# Patient Record
Sex: Male | Born: 1970 | Smoking: Never smoker
Health system: Southern US, Community
[De-identification: ages and names within clinical notes are randomized; demographics above are authoritative.]

## PROBLEM LIST (undated history)

## (undated) HISTORY — PX: APPENDECTOMY: SHX54

## (undated) HISTORY — PX: ANTERIOR CRUCIATE LIGAMENT REPAIR: SHX115

---

## 2021-04-18 ENCOUNTER — Other Ambulatory Visit: Payer: Self-pay

## 2021-04-18 ENCOUNTER — Ambulatory Visit (INDEPENDENT_AMBULATORY_CARE_PROVIDER_SITE_OTHER): Payer: BC Managed Care – PPO | Admitting: Family Medicine

## 2021-04-18 ENCOUNTER — Encounter: Payer: Self-pay | Admitting: Family Medicine

## 2021-04-18 VITALS — BP 122/68 | HR 62 | Temp 98.1°F | Resp 16 | Wt 199.2 lb

## 2021-04-18 DIAGNOSIS — L719 Rosacea, unspecified: Secondary | ICD-10-CM

## 2021-04-18 DIAGNOSIS — R1011 Right upper quadrant pain: Secondary | ICD-10-CM | POA: Diagnosis not present

## 2021-04-18 DIAGNOSIS — R21 Rash and other nonspecific skin eruption: Secondary | ICD-10-CM

## 2021-04-18 DIAGNOSIS — R12 Heartburn: Secondary | ICD-10-CM | POA: Diagnosis not present

## 2021-04-18 DIAGNOSIS — Z13220 Encounter for screening for lipoid disorders: Secondary | ICD-10-CM | POA: Diagnosis not present

## 2021-04-18 DIAGNOSIS — R140 Abdominal distension (gaseous): Secondary | ICD-10-CM

## 2021-04-18 DIAGNOSIS — Z1211 Encounter for screening for malignant neoplasm of colon: Secondary | ICD-10-CM

## 2021-04-18 LAB — LDL CHOLESTEROL, DIRECT: Direct LDL: 139 mg/dL

## 2021-04-18 LAB — COMPREHENSIVE METABOLIC PANEL
ALT: 31 U/L (ref 0–53)
AST: 23 U/L (ref 0–37)
Albumin: 4.3 g/dL (ref 3.5–5.2)
Alkaline Phosphatase: 57 U/L (ref 39–117)
BUN: 19 mg/dL (ref 6–23)
CO2: 30 mEq/L (ref 19–32)
Calcium: 9.6 mg/dL (ref 8.4–10.5)
Chloride: 102 mEq/L (ref 96–112)
Creatinine, Ser: 1.01 mg/dL (ref 0.40–1.50)
GFR: 85.59 mL/min (ref >60.00)
Glucose, Bld: 90 mg/dL (ref 70–99)
Potassium: 4.4 mEq/L (ref 3.5–5.1)
Sodium: 137 mEq/L (ref 135–145)
Total Bilirubin: 0.4 mg/dL (ref 0.2–1.2)
Total Protein: 7.2 g/dL (ref 6.0–8.3)

## 2021-04-18 LAB — LIPID PANEL
Cholesterol: 241 mg/dL — ABNORMAL HIGH (ref 0–200)
HDL: 50.9 mg/dL (ref >39.00)
NonHDL: 189.71
Total CHOL/HDL Ratio: 5
Triglycerides: 366 mg/dL — ABNORMAL HIGH (ref 0.0–149.0)
VLDL: 73.2 mg/dL — ABNORMAL HIGH (ref 0.0–40.0)

## 2021-04-18 MED ORDER — METRONIDAZOLE 1 % EX GEL: Freq: Every day | CUTANEOUS | 0 refills | Status: DC

## 2021-04-18 MED ORDER — OMEPRAZOLE 20 MG PO CPDR: 20.0000 mg | DELAYED_RELEASE_CAPSULE | Freq: Every day | ORAL | 1 refills | Status: DC

## 2021-04-18 NOTE — Patient Instructions (Signed)
Facial rash could be a combination of irritation from mask, also suspect a component of rosacea.  See information below.  Avoidance of spicy foods, sunlight sometimes can be helpful but can try metronidazole topical once per day for now as well.  Recheck next few weeks.  See information below on heartburn and foods that may contribute.  Can start omeprazole once per day for now, recheck next few weeks.  If bloating/heartburn is not improving can discuss other possible causes and treatments.  Follow-up sooner if worse.  Can discuss lab work at next visit.  Thank you for coming in today.  Heartburn Heartburn is a type of pain or discomfort that can happen in the throat or chest. It is often described as a burning pain. It may also cause a bad, acid-like taste in the mouth. Heartburn may feel worse when you lie down or bend over, and it is often worse at night. Heartburn may be caused by stomach contents that move back up into the esophagus (reflux). Follow these instructions at home: Eating and drinking  Avoid certain foods and drinks as told by your health care provider. This may include: Coffee and tea, with or without caffeine. Drinks that contain alcohol. Energy drinks and sports drinks. Carbonated drinks or sodas. Chocolate and cocoa. Peppermint and mint flavorings. Garlic and onions. Horseradish. Spicy and acidic foods, including peppers, chili powder, curry powder, vinegar, hot sauces, and barbecue sauce. Citrus fruit juices and citrus fruits, such as oranges, lemons, and limes. Tomato-based foods, such as red sauce, chili, salsa, and pizza with red sauce. Fried and fatty foods, such as donuts, french fries, potato chips, and high-fat dressings. High-fat meats, such as hot dogs and fatty cuts of red and white meats, such as rib eye steak, sausage, ham, and bacon. High-fat dairy items, such as whole milk, butter, and cream cheese. Eat small, frequent meals instead of large meals. Avoid  drinking large amounts of liquid with your meals. Avoid eating meals during the 2-3 hours before bedtime. Avoid lying down right after you eat. Do not exercise right after you eat. Lifestyle   If you are overweight, reduce your weight to an amount that is healthy for you. Ask your health care provider for guidance about a safe weight loss goal. Do not use any products that contain nicotine or tobacco. These products include cigarettes, chewing tobacco, and vaping devices, such as e-cigarettes. These can make symptoms worse. If you need help quitting, ask your health care provider. Wear loose-fitting clothing. Do not wear anything tight around your waist that causes pressure on your abdomen. Raise (elevate) the head of your bed about 6 inches (15 cm) when you sleep. You can use a wedge to do this. Try to reduce your stress, such as with yoga or meditation. If you need help reducing stress, ask your health care provider. Medicines Take over-the-counter and prescription medicines only as told by your health care provider. Do not take aspirin or NSAIDs, such as ibuprofen, unless your health care provider told you to do so. Stop medicines only as told by your health care provider. If you stop taking some medicines too quickly, your symptoms may get worse. General instructions Pay attention to any changes in your symptoms. Keep all follow-up visits. This is important. Contact a health care provider if: You have new symptoms. You have unexplained weight loss. You have difficulty swallowing, or it hurts to swallow. You have wheezing or a persistent cough. Your symptoms do not improve with treatment.  You have frequent heartburn for more than 2 weeks. Get help right away if: You suddenly have pain in your arms, neck, jaw, teeth, or back. You suddenly feel sweaty, dizzy, or light-headed. You have chest pain or shortness of breath. You vomit and your vomit looks like blood or coffee grounds. Your  stool is bloody or black. These symptoms may represent a serious problem that is an emergency. Do not wait to see if the symptoms will go away. Get medical help right away. Call your local emergency services (911 in the [place] Do not drive yourself to the hospital. Summary Heartburn is a type of pain or discomfort that can happen in the throat or chest. It is often described as a burning pain. It may also cause a bad, acid-like taste in the mouth. Avoid certain foods and drinks as told by your health care provider. Take over-the-counter and prescription medicines only as told by your health care provider. Do not take aspirin or NSAIDs, such as ibuprofen, unless your health care provider told you to do so. Contact a health care provider if your symptoms do not improve or they get worse. This information is not intended to replace advice given to you by your health care provider. Make sure you discuss any questions you have with your health care provider. Document Revised: 01/03/2020 Document Reviewed: 01/03/2020 Else[employer] Patient Education  2022 [employer]   Rosacea Rosacea is a long-term (chronic) condition that affects the skin of the face, including the cheeks, nose, forehead, and chin. This condition can also affect the eyes. Rosacea causes blood vessels near the surface of the skin to enlarge, which results in redness. What are the causes? The cause of this condition is not known. Certain triggers can make rosacea worse, including: Hot baths. Exercise. Sunlight. Very hot or cold temperatures. Hot or spicy foods and drinks. Drinking alcohol. Stress. Taking blood pressure medicine. Long-term use of topical steroids on the face. What increases the risk? You are more likely to develop this condition if you: Are older than 52 years of age. Are a woman. Have light-colored skin (light complexion). Have a family history of rosacea. What are the signs or symptoms? Symptoms of this  condition include: Redness of the face. Red bumps or pimples on the face. A red, enlarged nose. Blushing easily. Red lines on the skin. Irritated, burning, or itchy feeling in the eyes. Swollen eyelids. Drainage from the eyes. Feeling like there is something in your eye. How is this diagnosed? This condition is diagnosed with a medical history and physical exam. How is this treated? There is no cure for this condition, but treatment can help to control your symptoms. Your health care provider may recommend that you see a skin specialist (dermatologist). Treatment may include: Medicines that are applied to the skin or taken by mouth (orally). This can include antibiotic medicines. Laser treatment to improve the appearance of the skin. Surgery. This is rare. Your health care provider will also recommend the best way to take care of your skin. Even after your skin improves, you will likely need to continue treatment to prevent your rosacea from coming back. Follow these instructions at home: Skin care Take care of your skin as told by your health care provider. You may be told to do these things: Wash your skin gently two or more times each day. Use mild soap. Use a sunscreen or sunblock with SPF 30 or greater. Use gentle cosmetics that are meant for sensitive skin. Shave  with an electric shaver instead of a blade. Lifestyle Try to keep track of what foods trigger this condition. Avoid any triggers. These may include: Spicy foods. Seafood. Cheese. Hot liquids. Nuts. Chocolate. Iodized salt. Do not drink alcohol. Avoid extremely cold or hot temperatures. Try to reduce your stress. If you need help, talk with your health care provider. When you exercise, do these things to stay cool: Limit sun exposure to your face. Use a fan. Do shorter and more frequent intervals of exercise. General instructions Take and apply over-the-counter and prescription medicines only as told by your  health care provider. If you were prescribed an antibiotic medicine, apply it or take it as told by your health care provider. Do not stop using the antibiotic even if your condition improves. If your eyelids are affected, apply warm compresses to them. Do this as told by your health care provider. Keep all follow-up visits as told by your health care provider. This is important. Contact a health care provider if: Your symptoms get worse. Your symptoms do not improve after 2 months of treatment. You have new symptoms. You have any changes in vision or you have problems with your eyes, such as redness or itching. You feel depressed. You lose your appetite. You have trouble concentrating. Summary Rosacea is a long-term (chronic) condition that affects the skin of the face, including the cheeks, nose, forehead, and chin. Take care of your skin as told by your health care provider. Take and apply over-the-counter and prescription medicines only as told by your health care provider. Contact a health care provider if your symptoms get worse or if you have any changes in vision or other problems with your eyes, such as redness or itching. Keep all follow-up visits as told by your health care provider. This is important. This information is not intended to replace advice given to you by your health care provider. Make sure you discuss any questions you have with your health care provider. Document Revised: 12/01/2017 Document Reviewed: 12/01/2017 Elsevier Patient Education  2022 [employer].

## 2021-04-18 NOTE — Progress Notes (Signed)
Subjective:  Patient ID: Douglas Mcfarland, male    DOB: Sep 06, 1970  Age: 50 y.o. MRN: [id number]  CC:  Chief Complaint  Patient presents with   Establish Care    Pt here to establish care, notes since starting to wear masks he has some eczema under his mask he uses OTC creams for but little relief would like other recommendation, some bloating post eating but pt notes new over last week can monitor and return if recommended     HPI Douglas Mcfarland presents for   New patient establish care, recently moved in July from [place]. Had been working for company here past 2 years. His wife is also my patient, son is followed by my colleague Douglas Mcfarland. Moved for work, looking at options locally. Prior Patent examiner for [employer]   Facial rash: Notes since use of mask with pandemic? Past 59mo - year. Cheeks, face, only in area of rash. No other rash. Better with cloth mask, worse with N95, or in sun - more red.  On forehead after son. Frequent flying - double mask.  Alcohol: none.  Spicy food - does eat frequently. No change in face rash.  Tx: otc steroid 1% once every few weeks when worse - improves.  Recently using unknown serum.    Abdominal bloating: Noted after eating past week or two.  No f/n/v. Some heartburn. No treatments.  No unexplained weight loss. No melena/hematochezia.  Runner. Training for 1/2 marathon.  8 week into 14 week training. Some rib pain with running at times. Better with belching. Notices with eating at times as above.   HM: Per care everywhere, had mild hyperlipidemia with total cholesterol 204, LDL 120, triglycerides 144, HDL at 55 in April 2019. Cologuard 04/15/2020 per care everywhere -never submitted.  No FH of colon cancer Screening options with colonoscopy versus Cologuard discussed. Discussed timing of repeat testing intervals if normal, as well as potential need for diagnostic Colonoscopy if positive Cologuard. Understanding expressed, and chose  Cologuard.  No FH of prostate CA.  The natural history of prostate cancer and ongoing controversy regarding screening and potential treatment outcomes of prostate cancer has been discussed with the patient. The meaning of a false positive PSA and a false negative PSA has been discussed. He indicates understanding of the limitations of this screening test and wishes NOT to proceed with screening PSA testing.     History There are no problems to display for this patient.  History reviewed. No pertinent past medical history. Past Surgical History:  Procedure Laterality Date   ANTERIOR CRUCIATE LIGAMENT REPAIR Right    2012   APPENDECTOMY     1984   Not on File Prior to Admission medications   Not on File   Social History   Socioeconomic History   Marital status: Unknown    Spouse name: Not on file   Number of children: Not on file   Years of education: Not on file   Highest education level: Not on file  Occupational History   Not on file  Tobacco Use   Smoking status: Never   Smokeless tobacco: Never  Substance and Sexual Activity   Alcohol use: Never   Drug use: Never   Sexual activity: Yes  Other Topics Concern   Not on file  Social History Narrative   Not on file   Social Determinants of Health   Financial Resource Strain: Not on file  Food Insecurity: Not on file  Transportation Needs: Not  on file  Physical Activity: Not on file  Stress: Not on file  Social Connections: Not on file  Intimate Partner Violence: Not on file    Review of Systems Per HPI.   Objective:   Vitals:   04/18/21 1055  BP: 122/68  Pulse: 62  Resp: 16  Temp: 98.1 F (36.7 C)  TempSrc: Temporal  SpO2: 98%  Weight: 199 lb 3.2 oz (90.4 kg)     Physical Exam Vitals reviewed.  Constitutional:      Appearance: He is well-developed.  HENT:     Head: Normocephalic and atraumatic.  Neck:     Vascular: No carotid bruit or JVD.  Cardiovascular:     Rate and Rhythm: Normal  rate and regular rhythm.     Heart sounds: Normal heart sounds. No murmur heard. Pulmonary:     Effort: Pulmonary effort is normal.     Breath sounds: Normal breath sounds. No rales.  Abdominal:     General: Abdomen is flat. There is no distension.     Palpations: Abdomen is soft.     Tenderness: There is no abdominal tenderness. There is no right CVA tenderness, left CVA tenderness or guarding.     Comments: Locates previous area of discomfort at the right upper quadrant but nontender on exam, negative Murphy's.  Nondistended.  Musculoskeletal:     Right lower leg: No edema.     Left lower leg: No edema.  Skin:    General: Skin is warm and dry.       Neurological:     Mental Status: He is alert and oriented to person, place, and time.  Psychiatric:        Mood and Affect: Mood normal.    45 minutes spent during visit, including chart review, care everywhere lab review, counseling and assimilation of information, exam, discussion of plan, and chart completion.    Assessment & Plan:  Douglas Mcfarland is a 50 y.o. male . Facial rash - Plan: metroNIDAZOLE (METROGEL) 1 % gel Rosacea - Plan: metroNIDAZOLE (METROGEL) 1 % gel  -Acne versus rosacea versus combination of the 2.  Given symptom flare with sun exposure and exam findings, leaning more towards rosacea.  Metronidazole gel 1% topical, handout given on rosacea and triggers, recheck next few weeks  Bloating - Plan: Comprehensive metabolic panel Heartburn - Plan: omeprazole (PRILOSEC) 20 MG capsule RUQ discomfort - Plan: Comprehensive metabolic panel  -Handout on trigger avoidance for reflux/GERD, likely cause of right upper quadrant discomfort but will check CMP.  Reassuring exam.  Start omeprazole once per day, recheck next few weeks.  Screen for colon cancer - Plan: Cologuard  Screening for hyperlipidemia - Plan: Lipid panel  -Prior borderline elevation, repeat labs as fasting today.  Meds ordered this encounter  Medications    metroNIDAZOLE (METROGEL) 1 % gel    Sig: Apply topically daily.    Dispense:  45 g    Refill:  0   omeprazole (PRILOSEC) 20 MG capsule    Sig: Take 1 capsule (20 mg total) by mouth daily.    Dispense:  30 capsule    Refill:  1   Patient Instructions  Facial rash could be a combination of irritation from mask, also suspect a component of rosacea.  See information below.  Avoidance of spicy foods, sunlight sometimes can be helpful but can try metronidazole topical once per day for now as well.  Recheck next few weeks.  See information below on heartburn and foods that  may contribute.  Can start omeprazole once per day for now, recheck next few weeks.  If bloating/heartburn is not improving can discuss other possible causes and treatments.  Follow-up sooner if worse.  Can discuss lab work at next visit.  Thank you for coming in today.  Heartburn Heartburn is a type of pain or discomfort that can happen in the throat or chest. It is often described as a burning pain. It may also cause a bad, acid-like taste in the mouth. Heartburn may feel worse when you lie down or bend over, and it is often worse at night. Heartburn may be caused by stomach contents that move back up into the esophagus (reflux). Follow these instructions at home: Eating and drinking  Avoid certain foods and drinks as told by your health care provider. This may include: Coffee and tea, with or without caffeine. Drinks that contain alcohol. Energy drinks and sports drinks. Carbonated drinks or sodas. Chocolate and cocoa. Peppermint and mint flavorings. Garlic and onions. Horseradish. Spicy and acidic foods, including peppers, chili powder, curry powder, vinegar, hot sauces, and barbecue sauce. Citrus fruit juices and citrus fruits, such as oranges, lemons, and limes. Tomato-based foods, such as red sauce, chili, salsa, and pizza with red sauce. Fried and fatty foods, such as donuts, french fries, potato chips, and high-fat  dressings. High-fat meats, such as hot dogs and fatty cuts of red and white meats, such as rib eye steak, sausage, ham, and bacon. High-fat dairy items, such as whole milk, butter, and cream cheese. Eat small, frequent meals instead of large meals. Avoid drinking large amounts of liquid with your meals. Avoid eating meals during the 2-3 hours before bedtime. Avoid lying down right after you eat. Do not exercise right after you eat. Lifestyle   If you are overweight, reduce your weight to an amount that is healthy for you. Ask your health care provider for guidance about a safe weight loss goal. Do not use any products that contain nicotine or tobacco. These products include cigarettes, chewing tobacco, and vaping devices, such as e-cigarettes. These can make symptoms worse. If you need help quitting, ask your health care provider. Wear loose-fitting clothing. Do not wear anything tight around your waist that causes pressure on your abdomen. Raise (elevate) the head of your bed about 6 inches (15 cm) when you sleep. You can use a wedge to do this. Try to reduce your stress, such as with yoga or meditation. If you need help reducing stress, ask your health care provider. Medicines Take over-the-counter and prescription medicines only as told by your health care provider. Do not take aspirin or NSAIDs, such as ibuprofen, unless your health care provider told you to do so. Stop medicines only as told by your health care provider. If you stop taking some medicines too quickly, your symptoms may get worse. General instructions Pay attention to any changes in your symptoms. Keep all follow-up visits. This is important. Contact a health care provider if: You have new symptoms. You have unexplained weight loss. You have difficulty swallowing, or it hurts to swallow. You have wheezing or a persistent cough. Your symptoms do not improve with treatment. You have frequent heartburn for more than 2  weeks. Get help right away if: You suddenly have pain in your arms, neck, jaw, teeth, or back. You suddenly feel sweaty, dizzy, or light-headed. You have chest pain or shortness of breath. You vomit and your vomit looks like blood or coffee grounds. Your stool is  bloody or black. These symptoms may represent a serious problem that is an emergency. Do not wait to see if the symptoms will go away. Get medical help right away. Call your local emergency services (911 in the [place] Do not drive yourself to the hospital. Summary Heartburn is a type of pain or discomfort that can happen in the throat or chest. It is often described as a burning pain. It may also cause a bad, acid-like taste in the mouth. Avoid certain foods and drinks as told by your health care provider. Take over-the-counter and prescription medicines only as told by your health care provider. Do not take aspirin or NSAIDs, such as ibuprofen, unless your health care provider told you to do so. Contact a health care provider if your symptoms do not improve or they get worse. This information is not intended to replace advice given to you by your health care provider. Make sure you discuss any questions you have with your health care provider. Document Revised: 01/03/2020 Document Reviewed: 01/03/2020 Elsevier Patient Education  2022 Elsevier Inc.   Rosacea Rosacea is a long-term (chronic) condition that affects the skin of the face, including the cheeks, nose, forehead, and chin. This condition can also affect the eyes. Rosacea causes blood vessels near the surface of the skin to enlarge, which results in redness. What are the causes? The cause of this condition is not known. Certain triggers can make rosacea worse, including: Hot baths. Exercise. Sunlight. Very hot or cold temperatures. Hot or spicy foods and drinks. Drinking alcohol. Stress. Taking blood pressure medicine. Long-term use of topical steroids on the  face. What increases the risk? You are more likely to develop this condition if you: Are older than 52 years of age. Are a woman. Have light-colored skin (light complexion). Have a family history of rosacea. What are the signs or symptoms? Symptoms of this condition include: Redness of the face. Red bumps or pimples on the face. A red, enlarged nose. Blushing easily. Red lines on the skin. Irritated, burning, or itchy feeling in the eyes. Swollen eyelids. Drainage from the eyes. Feeling like there is something in your eye. How is this diagnosed? This condition is diagnosed with a medical history and physical exam. How is this treated? There is no cure for this condition, but treatment can help to control your symptoms. Your health care provider may recommend that you see a skin specialist (dermatologist). Treatment may include: Medicines that are applied to the skin or taken by mouth (orally). This can include antibiotic medicines. Laser treatment to improve the appearance of the skin. Surgery. This is rare. Your health care provider will also recommend the best way to take care of your skin. Even after your skin improves, you will likely need to continue treatment to prevent your rosacea from coming back. Follow these instructions at home: Skin care Take care of your skin as told by your health care provider. You may be told to do these things: Wash your skin gently two or more times each day. Use mild soap. Use a sunscreen or sunblock with SPF 30 or greater. Use gentle cosmetics that are meant for sensitive skin. Shave with an electric shaver instead of a blade. Lifestyle Try to keep track of what foods trigger this condition. Avoid any triggers. These may include: Spicy foods. Seafood. Cheese. Hot liquids. Nuts. Chocolate. Iodized salt. Do not drink alcohol. Avoid extremely cold or hot temperatures. Try to reduce your stress. If you need help, talk with  your health  care provider. When you exercise, do these things to stay cool: Limit sun exposure to your face. Use a fan. Do shorter and more frequent intervals of exercise. General instructions Take and apply over-the-counter and prescription medicines only as told by your health care provider. If you were prescribed an antibiotic medicine, apply it or take it as told by your health care provider. Do not stop using the antibiotic even if your condition improves. If your eyelids are affected, apply warm compresses to them. Do this as told by your health care provider. Keep all follow-up visits as told by your health care provider. This is important. Contact a health care provider if: Your symptoms get worse. Your symptoms do not improve after 2 months of treatment. You have new symptoms. You have any changes in vision or you have problems with your eyes, such as redness or itching. You feel depressed. You lose your appetite. You have trouble concentrating. Summary Rosacea is a long-term (chronic) condition that affects the skin of the face, including the cheeks, nose, forehead, and chin. Take care of your skin as told by your health care provider. Take and apply over-the-counter and prescription medicines only as told by your health care provider. Contact a health care provider if your symptoms get worse or if you have any changes in vision or other problems with your eyes, such as redness or itching. Keep all follow-up visits as told by your health care provider. This is important. This information is not intended to replace advice given to you by your health care provider. Make sure you discuss any questions you have with your health care provider. Document Revised: 12/01/2017 Document Reviewed: 12/01/2017 Elsevier Patient Education  2022 Elsevier Inc.    Signed,   Meredith Staggers, MD [facility], [facility] 04/18/21 2:05 PM

## 2021-05-13 DIAGNOSIS — Z1211 Encounter for screening for malignant neoplasm of colon: Secondary | ICD-10-CM | POA: Diagnosis not present

## 2021-05-14 ENCOUNTER — Encounter: Payer: Self-pay | Admitting: Family Medicine

## 2021-05-14 DIAGNOSIS — R1011 Right upper quadrant pain: Secondary | ICD-10-CM

## 2021-05-20 ENCOUNTER — Telehealth: Payer: Self-pay

## 2021-05-20 ENCOUNTER — Encounter: Payer: Self-pay | Admitting: Gastroenterology

## 2021-05-20 LAB — COLOGUARD: COLOGUARD: NEGATIVE

## 2021-05-20 NOTE — Telephone Encounter (Signed)
Error

## 2021-05-21 ENCOUNTER — Ambulatory Visit: Payer: BC Managed Care – PPO

## 2021-05-23 NOTE — Addendum Note (Signed)
Addended by: Meredith Staggers R on: 05/23/2021 01:34 PM   Modules accepted: Orders

## 2021-05-30 ENCOUNTER — Ambulatory Visit
Admission: RE | Admit: 2021-05-30 | Discharge: 2021-05-30 | Disposition: A | Discharge status: Home / Self Care | Payer: BC Managed Care – PPO | Source: Ambulatory Visit | Attending: Family Medicine | Admitting: Family Medicine

## 2021-05-30 DIAGNOSIS — R1011 Right upper quadrant pain: Secondary | ICD-10-CM

## 2021-06-10 ENCOUNTER — Ambulatory Visit (INDEPENDENT_AMBULATORY_CARE_PROVIDER_SITE_OTHER): Payer: BC Managed Care – PPO | Admitting: Gastroenterology

## 2021-06-10 ENCOUNTER — Encounter: Payer: Self-pay | Admitting: Gastroenterology

## 2021-06-10 VITALS — BP 112/80 | HR 59 | Ht 69.0 in | Wt 197.0 lb

## 2021-06-10 DIAGNOSIS — K219 Gastro-esophageal reflux disease without esophagitis: Secondary | ICD-10-CM

## 2021-06-10 DIAGNOSIS — R140 Abdominal distension (gaseous): Secondary | ICD-10-CM

## 2021-06-10 DIAGNOSIS — R1011 Right upper quadrant pain: Secondary | ICD-10-CM | POA: Diagnosis not present

## 2021-06-10 NOTE — Progress Notes (Signed)
06/10/2021 Douglas Mcfarland [id number] Nov 07, 1970   HISTORY OF PRESENT ILLNESS: This is a 50 year old male who is new to our office.  He is here today at the request of his PCP, Dr. Neva Seat, for evaluation of right upper quadrant abdominal discomfort.  He tells me that he he had started training for a half marathon.  He says that during the run he started to have pain in his right upper quadrant.  He says that he would have pain intermittently sometimes if he would eat and then run afterwards.  He says that now though it has been more persistent.  He says it is more annoying than anything.  He has had some bloating.  He does get occasional heartburn and reflux and takes omeprazole 20 mg as needed for that.  He says that he change his diet significantly, cutting out dairy tomatoes, etc.  He says been eating extremely healthy and that has made a huge difference.  He says that he still does notice the discomfort to degree, however.  No dysphagia.  He says that he does not like to take medication regularly.  He had an ultrasound performed that was unremarkable.  CMP unremarkable.  CBC not performed.  He denies any nausea or vomiting.  He moves his bowels just fine.  No rectal bleeding.  He had a negative Cologuard earlier this month.   History reviewed. No pertinent past medical history. Past Surgical History:  Procedure Laterality Date   ANTERIOR CRUCIATE LIGAMENT REPAIR Right    2012   ANTERIOR CRUCIATE LIGAMENT REPAIR     APPENDECTOMY     1984    reports that he has never smoked. He has never used smokeless tobacco. He reports that he does not drink alcohol and does not use drugs. family history includes Crohn's disease in his son; High Cholesterol in his mother; Hyperlipidemia in his mother; Parkinson's disease in his father; Parkinsonism in his father. No Known Allergies    Outpatient Encounter Medications as of 06/10/2021  Medication Sig   metroNIDAZOLE (METROGEL) 1 % gel Apply topically  daily.   omeprazole (PRILOSEC) 20 MG capsule Take 1 capsule (20 mg total) by mouth daily.   No facility-administered encounter medications on file as of 06/10/2021.    REVIEW OF SYSTEMS  : All other systems reviewed and negative except where noted in the History of Present Illness.   PHYSICAL EXAM: BP 112/80 (BP Location: Left Arm, Patient Position: Sitting, Cuff Size: Normal)   Pulse (!) 59   Ht 5\' 9"  (1.753 m)   Wt 197 lb (89.4 kg)   SpO2 99%   BMI 29.09 kg/m  General: Well developed male in no acute distress Head: Normocephalic and atraumatic Eyes:  Sclerae anicteric, conjunctiva pink. Ears: Normal auditory acuity Lungs: Clear throughout to auscultation; no W/R/R. Heart: Regular rate and rhythm; no M/R/G. Abdomen: Soft, non-distended.  BS present.  Non-tender. Musculoskeletal: Symmetrical with no gross deformities  Skin: No lesions on visible extremities Extremities: No edema  Neurological: Alert oriented x 4, grossly non-focal Psychological:  Alert and cooperative. Normal mood and affect  ASSESSMENT AND PLAN: *50 year old male with complaints of right upper quadrant abdominal pain some mild GERD and abdominal bloating: He really does not like the thoughts of having to have surgery and really does not like taking medication.  We discussed that this could be dysfunctional gallbladder.  He does drink a lot of coffee so question if this could be reflux related.  Takes  omeprazole 20 mg on occasion.  ? If it is musculoskeletal.  We discussed a HIDA scan and trial of PPI regularly.  He says that he is going to eliminate the coffee from his diet and think about and consider the other options.  If he decides to proceed with HIDA scan that he will call our office at which time we can schedule that for him.  Otherwise he will follow-up. *CRC screening:  Had negative Cologuard in 05/2021.  CC:  Shade Flood, MD

## 2021-06-10 NOTE — Patient Instructions (Addendum)
Call back if you want to have the HIDA scan completed, ask for Hilma Favors, RN.  Can try Omeprazole 20 mg daily for 4 weeks.  If you are age 52 or older, your body mass index should be between 23-30. Your Body mass index is 29.09 kg/m. If this is out of the aforementioned range listed, please consider follow up with your Primary Care Provider.  If you are age 18 or younger, your body mass index should be between 19-25. Your Body mass index is 29.09 kg/m. If this is out of the aformentioned range listed, please consider follow up with your Primary Care Provider.   ________________________________________________________  The [facility] providers would like to encourage you to use [facility] to communicate with providers for non-urgent requests or questions.  Due to long hold times on the telephone, sending your provider a message by [facility] may be a faster and more efficient way to get a response.  Please allow 48 business hours for a response.  Please remember that this is for non-urgent requests.  _______________________________________________________

## 2021-06-12 NOTE — Progress Notes (Signed)
Addendum: Reviewed and agree with assessment and management plan. Ishi Danser M, MD  

## 2021-10-08 ENCOUNTER — Encounter: Payer: Self-pay | Admitting: Family Medicine

## 2021-10-13 ENCOUNTER — Ambulatory Visit: Payer: BC Managed Care – PPO | Admitting: Family Medicine

## 2022-03-24 ENCOUNTER — Encounter: Payer: Self-pay | Admitting: Physician Assistant

## 2022-03-24 ENCOUNTER — Ambulatory Visit: Payer: BC Managed Care – PPO

## 2022-03-24 ENCOUNTER — Ambulatory Visit (INDEPENDENT_AMBULATORY_CARE_PROVIDER_SITE_OTHER): Payer: BC Managed Care – PPO | Admitting: Physician Assistant

## 2022-03-24 VITALS — BP 110/70 | HR 60 | Temp 98.0°F | Ht 69.0 in | Wt 205.0 lb

## 2022-03-24 DIAGNOSIS — Z23 Encounter for immunization: Secondary | ICD-10-CM | POA: Diagnosis not present

## 2022-03-24 DIAGNOSIS — H5789 Other specified disorders of eye and adnexa: Secondary | ICD-10-CM | POA: Diagnosis not present

## 2022-03-24 MED ORDER — ERYTHROMYCIN 5 MG/GM OP OINT: TOPICAL_OINTMENT | OPHTHALMIC | 0 refills | Status: DC

## 2022-03-24 NOTE — Progress Notes (Signed)
Douglas Mcfarland is a 51 y.o. male here for a new problem.  History of Present Illness:   Chief Complaint  Patient presents with   Eye Problem    Pt c/o right eye swollen and some pain x 3 days.    HPI  R eye swelling Patient reports that he developed R eye swelling. When he woke up this morning swelling was worse. Did have some itchiness at first. He is traveling soon and wants to "get ahead of this" and states that if he had not been traveling he probably would not have come in.  Has tried ibuprofen, zyrtec, tea bag compress. Doesn't recall that she has gotten something in his eye. Denies vision changes.   History reviewed. No pertinent past medical history.   Social History   Tobacco Use   Smoking status: Never   Smokeless tobacco: Never  Substance Use Topics   Alcohol use: Never   Drug use: Never    Past Surgical History:  Procedure Laterality Date   ANTERIOR CRUCIATE LIGAMENT REPAIR Right    2012   ANTERIOR CRUCIATE LIGAMENT REPAIR     APPENDECTOMY     1984    Family History  Problem Relation Age of Onset   Hyperlipidemia Mother    High Cholesterol Mother    Parkinson's disease Father    Parkinsonism Father    Crohn's disease Son     No Known Allergies  Current Medications:   Current Outpatient Medications:    erythromycin ophthalmic ointment, Apply to affected eye daily, Disp: 3.5 g, Rfl: 0   Review of Systems:   ROS Negative unless otherwise specified per HPI.  Vitals:   Vitals:   03/24/22 0953  BP: 110/70  Pulse: 60  Temp: 98 F (36.7 C)  TempSrc: Temporal  SpO2: 98%  Weight: 205 lb (93 kg)  Height: 5\' 9"  (1.753 m)     Body mass index is 30.27 kg/m.  Physical Exam:   Physical Exam Vitals and nursing note reviewed.  Constitutional:      General: He is not in acute distress.    Appearance: He is well-developed. He is not ill-appearing or toxic-appearing.  HENT:     Head: Normocephalic and atraumatic.     Right Ear: Tympanic  membrane, ear canal and external ear normal. Tympanic membrane is not erythematous, retracted or bulging.     Left Ear: Tympanic membrane, ear canal and external ear normal. Tympanic membrane is not erythematous, retracted or bulging.     Nose: Nose normal.     Right Sinus: No maxillary sinus tenderness or frontal sinus tenderness.     Left Sinus: No maxillary sinus tenderness or frontal sinus tenderness.     Mouth/Throat:     Pharynx: Uvula midline. No posterior oropharyngeal erythema.  Eyes:     General:        Right eye: Hordeolum present. No foreign body or discharge.        Left eye: No foreign body or discharge.     Extraocular Movements:     Right eye: Normal extraocular motion.     Left eye: Normal extraocular motion.     Conjunctiva/sclera: Conjunctivae normal.     Comments: Slight erythema and swelling to R lower lid Inner lid with small lesion  Neck:     Trachea: Trachea normal.  Cardiovascular:     Rate and Rhythm: Normal rate and regular rhythm.     Heart sounds: Normal heart sounds, S1 normal and S2 normal.  Pulmonary:     Effort: Pulmonary effort is normal.     Breath sounds: Normal breath sounds. No decreased breath sounds, wheezing, rhonchi or rales.  Lymphadenopathy:     Cervical: No cervical adenopathy.  Skin:    General: Skin is warm and dry.  Neurological:     Mental Status: He is alert.  Psychiatric:        Speech: Speech normal.        Behavior: Behavior normal. Behavior is cooperative.     Assessment and Plan:   Eye swelling, right Suspect possible stye No red flags Recommend erythromycin ointment and compresses NSAIDs for swelling/pain If new/worsening symptoms, needs to be re-evaluated   Jarold Motto, PA-C

## 2022-04-16 ENCOUNTER — Encounter: Payer: BC Managed Care – PPO | Admitting: Family Medicine

## 2022-04-16 IMAGING — US US ABDOMEN LIMITED
1 series · 14 of 25 positions shown · non-contrast
Comparison: None.

CLINICAL DATA: Upper abdominal pain.

EXAM:
ULTRASOUND ABDOMEN LIMITED RIGHT UPPER QUADRANT

[Series 1: us abdomen limited · 0.26mm/px · 14 of 35 slices shown]
[im 1/35]
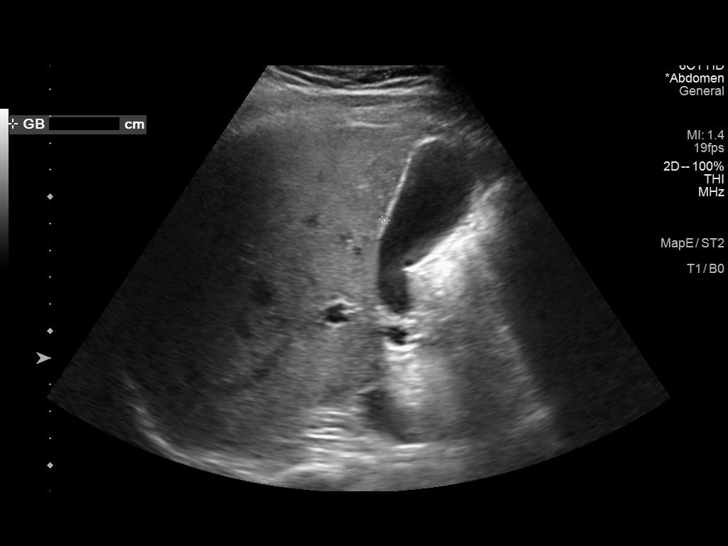
[im 3/35]
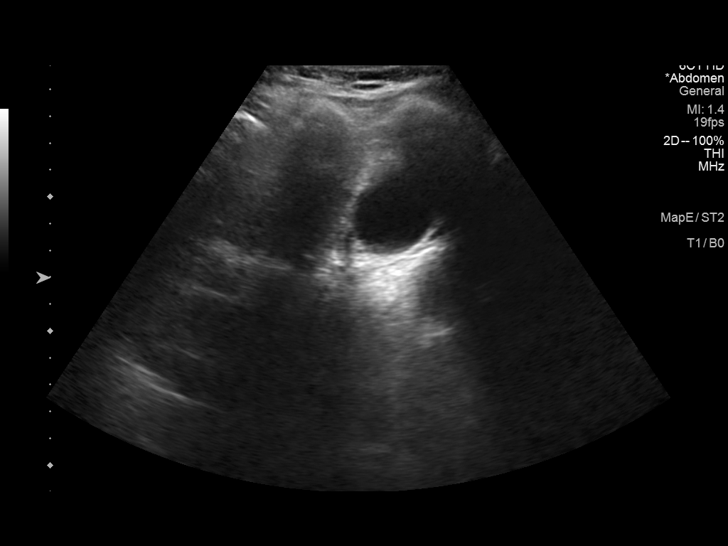
[im 6/35]
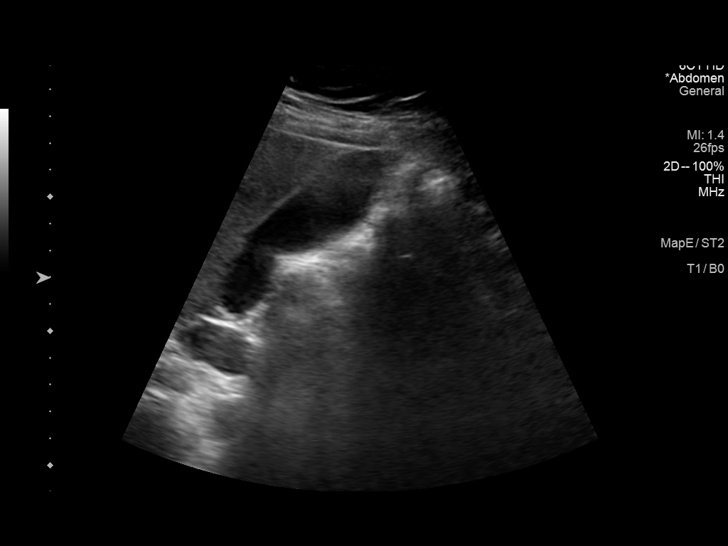
[im 9/35]
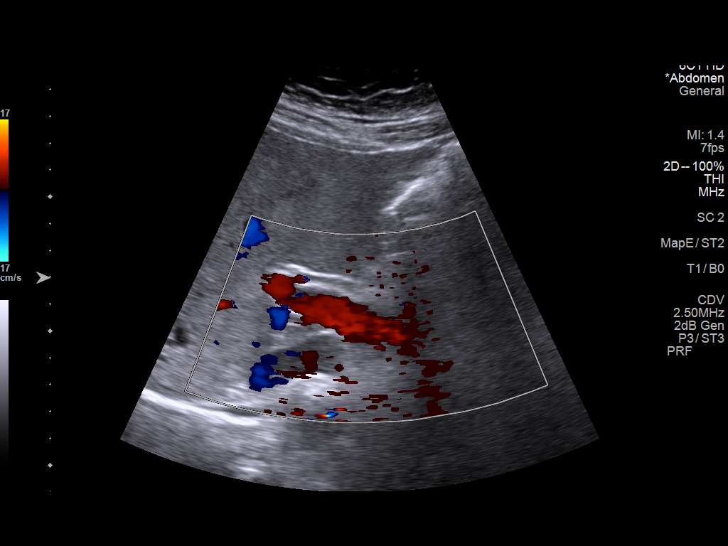
[im 12/35]
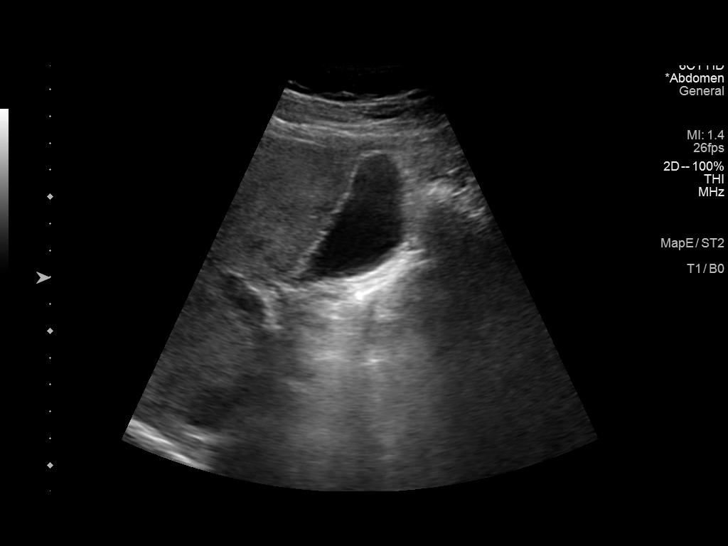
[im 13/35]
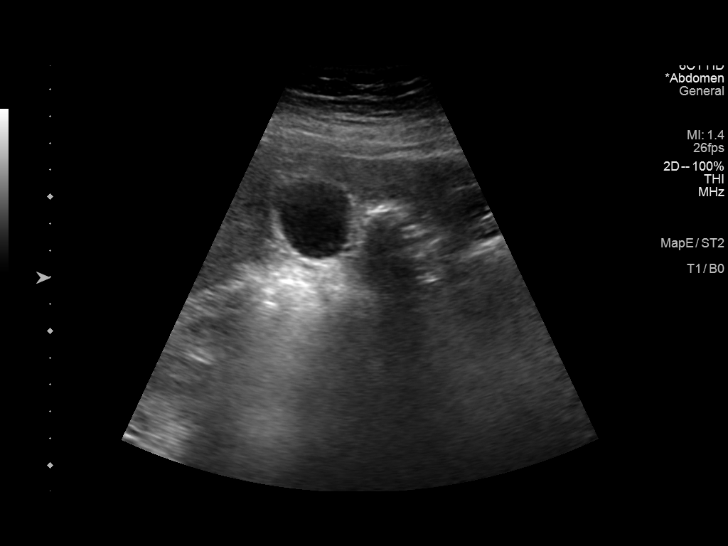
[im 16/35]
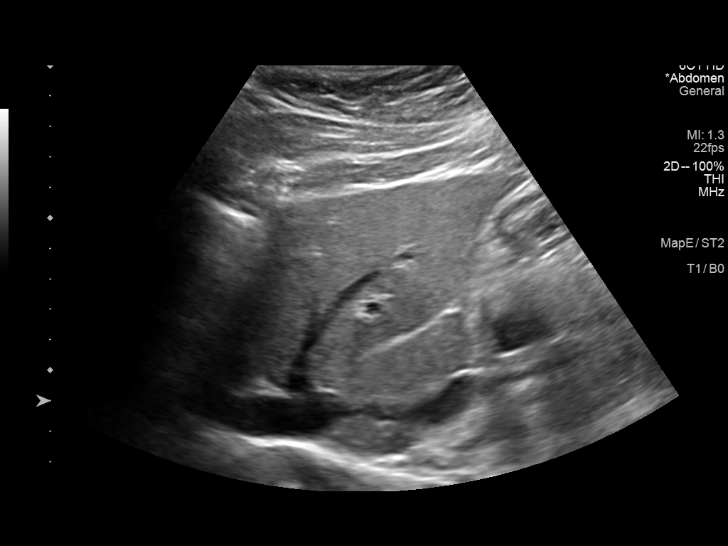
[im 19/35]
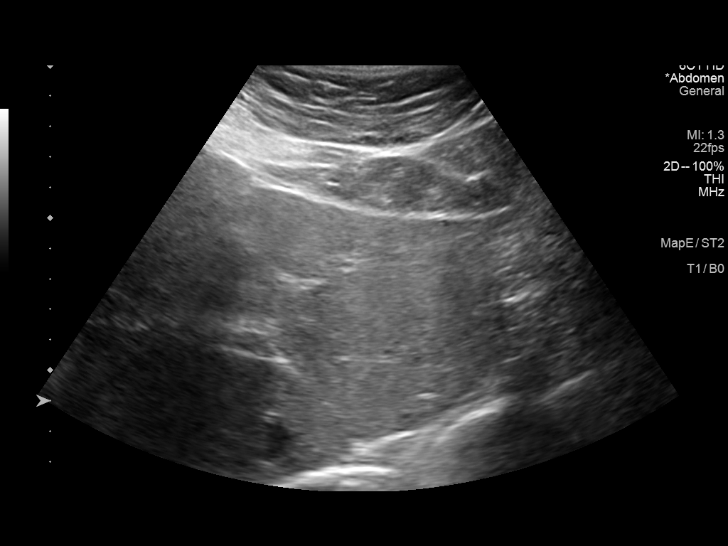
[im 22/35]
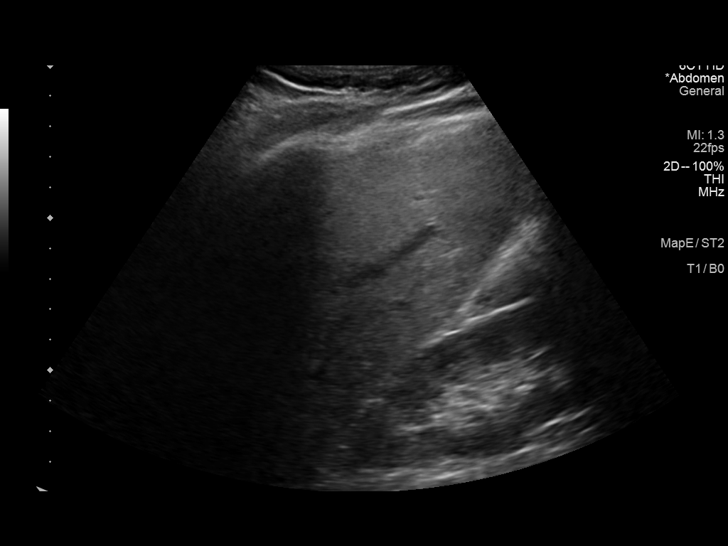
[im 23/35]
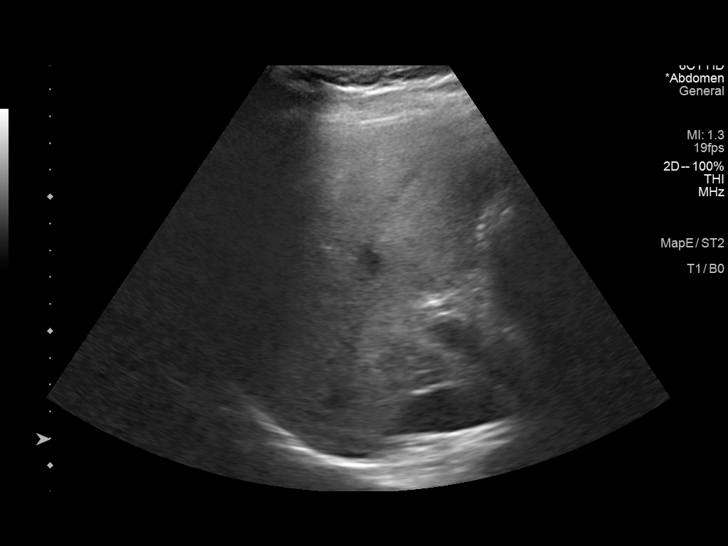
[im 26/35]
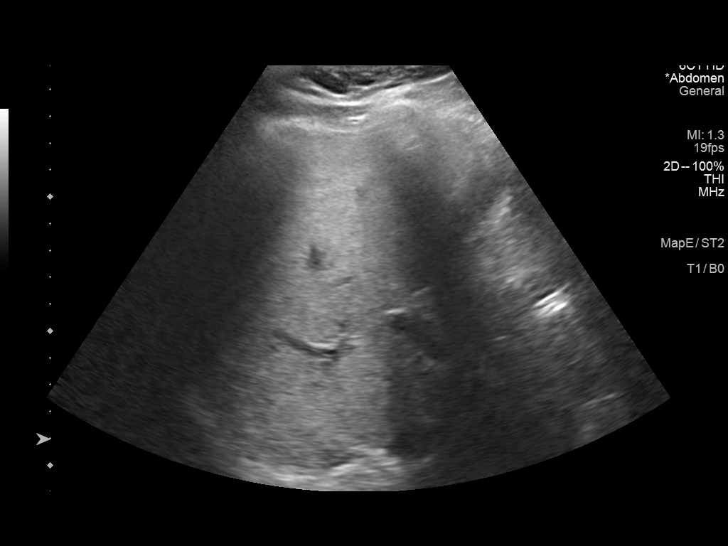
[im 29/35]
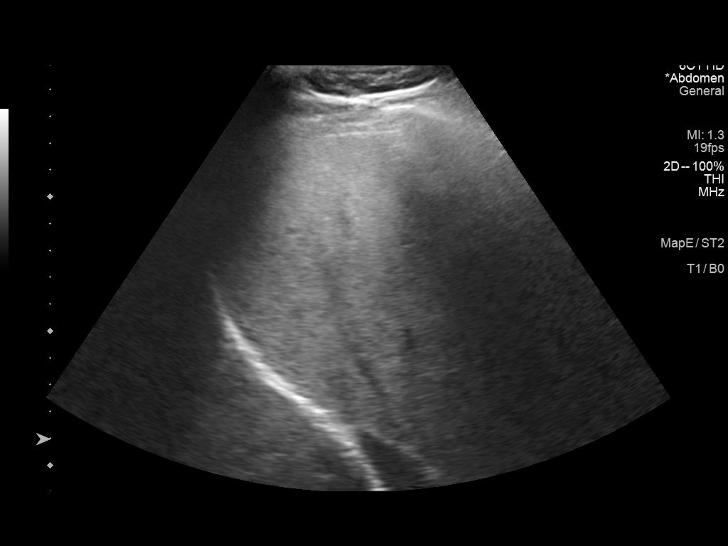
[im 32/35]
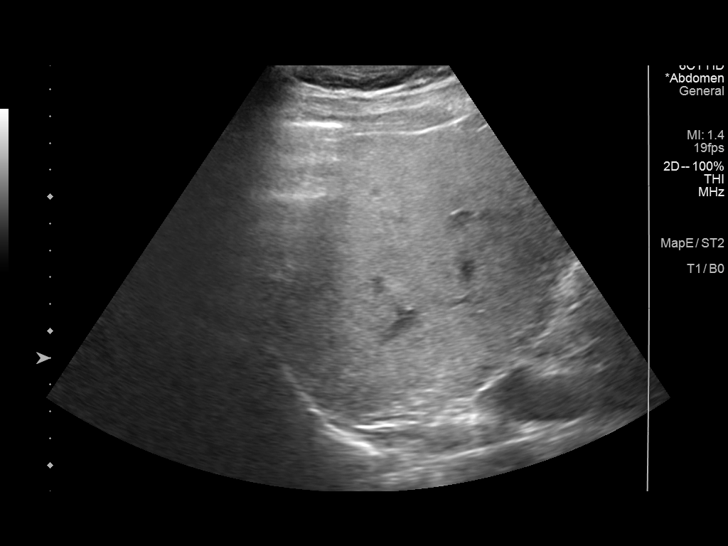
[im 35/35]
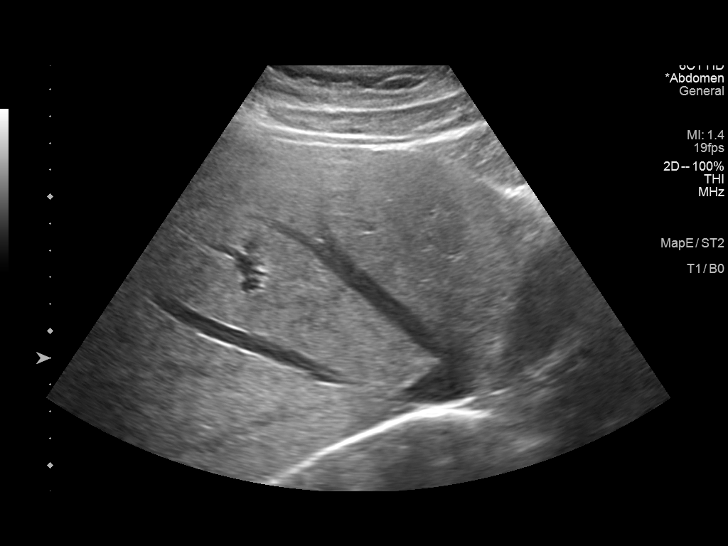

[14 of 25 positions shown; findings below may reference images not displayed]

FINDINGS: Gallbladder:

No gallstones or wall thickening visualized. No sonographic Murphy
sign noted by sonographer.

Common bile duct:

Diameter: 2.1 mm

Liver:

No focal lesion identified. Increased parenchymal echogenicity.
Portal vein is patent on color Doppler imaging with normal direction
of blood flow towards the liver.

Other: None.
IMPRESSION: 1. Echogenic liver likely related to fatty infiltration.
2. No cholelithiasis.

## 2022-04-20 ENCOUNTER — Ambulatory Visit (INDEPENDENT_AMBULATORY_CARE_PROVIDER_SITE_OTHER): Payer: BC Managed Care – PPO | Admitting: Family Medicine

## 2022-04-20 ENCOUNTER — Encounter: Payer: Self-pay | Admitting: Family Medicine

## 2022-04-20 VITALS — BP 122/76 | HR 78 | Temp 98.1°F | Ht 69.0 in | Wt 209.6 lb

## 2022-04-20 DIAGNOSIS — H029 Unspecified disorder of eyelid: Secondary | ICD-10-CM

## 2022-04-20 DIAGNOSIS — H0012 Chalazion right lower eyelid: Secondary | ICD-10-CM | POA: Diagnosis not present

## 2022-04-20 MED ORDER — ERYTHROMYCIN 5 MG/GM OP OINT: TOPICAL_OINTMENT | Freq: Three times a day (TID) | OPHTHALMIC | 0 refills | Status: AC

## 2022-04-20 NOTE — Telephone Encounter (Signed)
Pt has an appt today at  3pm °

## 2022-04-20 NOTE — Patient Instructions (Signed)
Based on your exam I am suspicious of a possible chalazion.  See information below.  This can occur after a stye, and usually will improve with warm compresses and ointments but I will refer you to ophthalmology just to make sure no other treatment or procedures needed.  I did write for the ointment to use a few times per day for now, continue warm compresses.  If any change in vision, or worsening be seen right away.  Thanks for coming in today.   Chalazion  A chalazion is a swelling or lump on the eyelid. It can affect the upper eyelid or the lower eyelid. What are the causes? This condition may be caused by: Long-lasting (chronic) inflammation of the eyelid glands. A blocked oil gland in the eyelid. What are the signs or symptoms? Symptoms of this condition include: Swelling of the eyelid that: May spread to areas around the eye. May be painful. A hard lump on the eyelid. Blurry vision. The lump may make it hard to see out of the eye. How is this diagnosed? This condition is diagnosed with an examination of the eye. How is this treated? This condition is treated by applying a warm, moist cloth (warm compress) to the eyelid. If the condition does not improve, it may be treated with: Medicine that is applied to the eye. Oral medicines. Medicine that is injected into the chalazion. Surgery. Follow these instructions at home: Managing pain and swelling Apply a warm compress to the eyelid for 10-15 minutes, 4 to 6 times a day. This will help to open any blocked glands and to reduce redness and swelling. Take and apply over-the-counter and prescription medicines only as told by your health care provider. General instructions Do not touch the chalazion. Do not try to remove the pus. Do not squeeze the chalazion or stick it with a pin or needle. Do not rub your eyes. Wash your hands often with soap and water for at least 20 seconds. Dry your hands with a clean towel. Keep your face,  scalp, and eyebrows clean. Avoid wearing eye makeup. Keep all follow-up visits. This is important. Contact a health care provider if: Your eyelid is getting worse. You have a fever. The chalazion does not break open (rupture) or go away on its own and your eyelid has not improved for 4 weeks. Get help right away if: You have pain in your eye. Your vision worsens. The chalazion becomes painful or red. The chalazion gets bigger. Summary A chalazion is a swelling or lump on the upper or lower eyelid. It may be caused by chronic inflammation or a blocked oil gland. Apply a warm compress to the eyelid for 10-15 minutes, 4 to 6 times a day. Keep your face, scalp, and eyebrows clean. This information is not intended to replace advice given to you by your health care provider. Make sure you discuss any questions you have with your health care provider. Document Revised: 09/04/2020 Document Reviewed: 09/04/2020 Elsevier Patient Education  [place].

## 2022-04-20 NOTE — Progress Notes (Signed)
Subjective:  Patient ID: Douglas Mcfarland, male    DOB: 02/18/1971  Age: 51 y.o. MRN: [id number]  CC:  Chief Complaint  Patient presents with   RIGHT EYE SWELLING    Pt states eye has been swelling for 3 weeks and has been treated for the swelling with no relief     HPI Douglas Mcfarland presents for   Right eye swelling Other providers note reviewed from September 12.  Approximately 3-day history at that time.  Woke up with worsening swelling on the 12th.  Initial itching.  No known foreign body.  No vision changes at that time.  Tried ibuprofen, Zyrtec, teabag.   Possible stye. Tx : erythromycin ointment - slight better on ointment, worse when ran out.  Better with hot water compress. Similar sx's in past -stye, went away on own, no prior surgery/treatment. Hot water compress worked best in past.  No vision changes, itching.  No optho.     History Patient Active Problem List   Diagnosis Date Noted   RUQ abdominal pain 06/10/2021   Bloating 06/10/2021   Gastroesophageal reflux disease 06/10/2021   No past medical history on file. Past Surgical History:  Procedure Laterality Date   ANTERIOR CRUCIATE LIGAMENT REPAIR Right    2012   ANTERIOR CRUCIATE LIGAMENT REPAIR     APPENDECTOMY     1984   No Known Allergies Prior to Admission medications   Medication Sig Start Date End Date Taking? Authorizing Provider  erythromycin ophthalmic ointment Apply to affected eye daily 03/24/22   Jarold Motto, PA   Social History   Socioeconomic History   Marital status: Unknown    Spouse name: Not on file   Number of children: Not on file   Years of education: Not on file   Highest education level: Not on file  Occupational History   Not on file  Tobacco Use   Smoking status: Never   Smokeless tobacco: Never  Substance and Sexual Activity   Alcohol use: Never   Drug use: Never   Sexual activity: Yes  Other Topics Concern   Not on file  Social History Narrative   Not on file    Social Determinants of Health   Financial Resource Strain: Not on file  Food Insecurity: Not on file  Transportation Needs: Not on file  Physical Activity: Not on file  Stress: Not on file  Social Connections: Not on file  Intimate Partner Violence: Not on file    Review of Systems Per HPI.   Objective:   Vitals:   04/20/22 0827  BP: 122/76  Pulse: 78  Temp: 98.1 F (36.7 C)  SpO2: 97%  Weight: 209 lb 9.6 oz (95.1 kg)  Height: 5\' 9"  (1.753 m)     Physical Exam Vitals reviewed.  Constitutional:      General: He is not in acute distress.    Appearance: Normal appearance. He is well-developed.  HENT:     Head: Normocephalic and atraumatic.  Eyes:     Extraocular Movements: Extraocular movements intact.     Pupils: Pupils are equal, round, and reactive to light.     Comments: Right eye, lower lid with possible small shallow ulcerated appearing area just inside the eyelash, and growth just inside the eyelid.  See photos.  No exudate appreciated.  No significant eyelid edema or erythema appreciated.  No exudate at canthi.  No significant scleral injection appreciated.  Cardiovascular:     Rate and Rhythm: Normal rate.  Pulmonary:     Effort: Pulmonary effort is normal.  Neurological:     Mental Status: He is alert and oriented to person, place, and time.  Psychiatric:        Mood and Affect: Mood normal.         Assessment & Plan:  Douglas Mcfarland is a 51 y.o. male . Lesion of right eyelid - Plan: Ambulatory referral to Ophthalmology, erythromycin ophthalmic ointment, CANCELED: Tdap vaccine greater than or equal to 7yo IM  Chalazion of right lower eyelid - Plan: Ambulatory referral to Ophthalmology, erythromycin ophthalmic ointment Suspected chalazion after initial stye.  Continue warm compresses, restart erythromycin ointment, urgent ophthalmology referral placed.  RTC precautions.  Meds ordered this encounter  Medications   erythromycin ophthalmic ointment     Sig: Place into the right eye 3 (three) times daily. Apply to lower lid, 1/2 inch ribbon. Up to 1 week.    Dispense:  3.5 g    Refill:  0   Patient Instructions  Based on your exam I am suspicious of a possible chalazion.  See information below.  This can occur after a stye, and usually will improve with warm compresses and ointments but I will refer you to ophthalmology just to make sure no other treatment or procedures needed.  I did write for the ointment to use a few times per day for now, continue warm compresses.  If any change in vision, or worsening be seen right away.  Thanks for coming in today.   Chalazion  A chalazion is a swelling or lump on the eyelid. It can affect the upper eyelid or the lower eyelid. What are the causes? This condition may be caused by: Long-lasting (chronic) inflammation of the eyelid glands. A blocked oil gland in the eyelid. What are the signs or symptoms? Symptoms of this condition include: Swelling of the eyelid that: May spread to areas around the eye. May be painful. A hard lump on the eyelid. Blurry vision. The lump may make it hard to see out of the eye. How is this diagnosed? This condition is diagnosed with an examination of the eye. How is this treated? This condition is treated by applying a warm, moist cloth (warm compress) to the eyelid. If the condition does not improve, it may be treated with: Medicine that is applied to the eye. Oral medicines. Medicine that is injected into the chalazion. Surgery. Follow these instructions at home: Managing pain and swelling Apply a warm compress to the eyelid for 10-15 minutes, 4 to 6 times a day. This will help to open any blocked glands and to reduce redness and swelling. Take and apply over-the-counter and prescription medicines only as told by your health care provider. General instructions Do not touch the chalazion. Do not try to remove the pus. Do not squeeze the chalazion or stick  it with a pin or needle. Do not rub your eyes. Wash your hands often with soap and water for at least 20 seconds. Dry your hands with a clean towel. Keep your face, scalp, and eyebrows clean. Avoid wearing eye makeup. Keep all follow-up visits. This is important. Contact a health care provider if: Your eyelid is getting worse. You have a fever. The chalazion does not break open (rupture) or go away on its own and your eyelid has not improved for 4 weeks. Get help right away if: You have pain in your eye. Your vision worsens. The chalazion becomes painful or red. The chalazion gets bigger.  Summary A chalazion is a swelling or lump on the upper or lower eyelid. It may be caused by chronic inflammation or a blocked oil gland. Apply a warm compress to the eyelid for 10-15 minutes, 4 to 6 times a day. Keep your face, scalp, and eyebrows clean. This information is not intended to replace advice given to you by your health care provider. Make sure you discuss any questions you have with your health care provider. Document Revised: 09/04/2020 Document Reviewed: 09/04/2020 Elsevier Patient Education  [place],   Merri Ray, MD [place] 04/20/22 9:06 AM

## 2022-08-13 NOTE — Progress Notes (Signed)
This encounter was created in error - please disregard.

## 2022-12-18 ENCOUNTER — Ambulatory Visit: Payer: BC Managed Care – PPO | Admitting: Family Medicine

## 2023-09-03 ENCOUNTER — Telehealth: Payer: Self-pay | Admitting: Family Medicine

## 2023-09-03 NOTE — Telephone Encounter (Signed)
 We will address this Monday unless Dr Neva Seat advises otherwise

## 2023-09-03 NOTE — Telephone Encounter (Signed)
 Copied from CRM [id number]. Topic: Referral - Request for Referral >> Sep 03, 2023  7:57 AM Adele Barthel wrote: Did the patient discuss referral with their provider in the last year? Yes (If No - schedule appointment) (If Yes - send message)  Appointment offered? Yes, scheduled for 02/24 at 11:20 AM  Type of order/referral and detailed reason for visit: Referral for orthopedics due to right shoulder pain  Preference of office, provider, location: N/a  If referral order, have you been seen by this specialty before? No (If Yes, this issue or another issue? When? Where?  Can we respond through MyChart? Yes  Visit scheduled for 09/06/23

## 2023-09-03 NOTE — Telephone Encounter (Signed)
 Agree.  Will evaluate symptoms and need for referral on Monday.  Thanks

## 2023-09-06 ENCOUNTER — Ambulatory Visit (INDEPENDENT_AMBULATORY_CARE_PROVIDER_SITE_OTHER): Payer: BC Managed Care – PPO | Admitting: Family Medicine

## 2023-09-06 VITALS — BP 126/84 | HR 61 | Temp 98.2°F | Ht 69.0 in | Wt 183.6 lb

## 2023-09-06 DIAGNOSIS — M25511 Pain in right shoulder: Secondary | ICD-10-CM | POA: Diagnosis not present

## 2023-09-06 DIAGNOSIS — M7521 Bicipital tendinitis, right shoulder: Secondary | ICD-10-CM | POA: Diagnosis not present

## 2023-09-06 DIAGNOSIS — M7581 Other shoulder lesions, right shoulder: Secondary | ICD-10-CM

## 2023-09-06 MED ORDER — MELOXICAM 7.5 MG PO TABS: 7.5000 mg | ORAL_TABLET | Freq: Every day | ORAL | 0 refills | Status: AC | PRN

## 2023-09-06 NOTE — Patient Instructions (Addendum)
 See info on shoulder pain below. I suspect you have some rotator cuff syndrome and possible biceps tendon inflammation.  Start meloxicam once per day. Do not combine with advil. I will refer you to orthopaedic specialist.  Cut back on overhead lifting and decrease weight for shoulder exercises for now.  Let me know if there are questions.    Shoulder Pain Many things can cause shoulder pain, including: An injury to the shoulder. Overuse of the shoulder. Arthritis. The source of the pain can be: Inflammation. An injury to the shoulder joint. An injury to a tendon, ligament, or bone. Follow these instructions at home: Pay attention to changes in your symptoms. Let your health care provider know about them. Follow these instructions to relieve your pain. If you have a removable sling: Wear the sling as told by your provider. Remove it only as told by your provider. Check the skin around the sling every day. Tell your provider about any concerns. Loosen the sling if your fingers tingle, become numb, or become cold. Keep the sling clean. If the sling is not waterproof: Do not let it get wet. Remove it to shower or bathe. Move your arm as little as possible, but keep your hand moving to prevent swelling. Managing pain, stiffness, and swelling  If told, put ice on the painful area. If you have a removable sling or immobilizer, remove it as told by your provider. Put ice in a plastic bag. Place a towel between your skin and the bag. Leave the ice on for 20 minutes, 2-3 times a day. If your skin turns bright red, remove the ice right away to prevent skin damage. The risk of damage is higher if you cannot feel pain, heat, or cold. Move your fingers often to reduce stiffness and swelling. Squeeze a soft ball or a foam pad as much as possible. This helps to keep the shoulder from swelling. It also helps to strengthen the arm. General instructions Take over-the-counter and prescription  medicines only as told by your provider. Exercise may help with pain management. Perform exercises if told by your provider. You may be referred to a physical therapist to help in your recovery process. Keep all follow-up visits in order to avoid any type of permanent shoulder disability or chronic pain problems. Contact a health care provider if: Your pain is not relieved with medicines. New pain develops in your arm, hand, or fingers. You loosen your sling and your arm, hand, or fingers remain tingly, numb, swollen, or painful. Get help right away if: Your arm, hand, or fingers turn white or blue. This information is not intended to replace advice given to you by your health care provider. Make sure you discuss any questions you have with your health care provider. Document Revised: 01/30/2022 Document Reviewed: 01/30/2022 Elsevier Patient Education  2024 [employer].

## 2023-09-06 NOTE — Progress Notes (Signed)
 Subjective:  Patient ID: Douglas Mcfarland, male    DOB: [birth year]  Age: 54 y.o. MRN: [id number]  CC:  Chief Complaint  Patient presents with   Shoulder Pain    Pt notes Rt Shoulder pain 3-4 months, still weight lifting and working out, has started prevent him from raising his arm above his head, notes difficulty sleeping, constant aching pain, some positions hurt more than others     HPI Douglas Mcfarland presents for   Right shoulder pain Pain in R shoulder for years. 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 Past few months has been worsening. No specific injury recalled. Possible vague injury about 25 yrs ago. No hx of dislocation.  still been able to workout, weight lifts, but now having some increased pain - trouble flexing arm.abduction at times.  some difficulty sleeping due to discomfort, constant ache. No weakness, except with flexion d/t pain.   Attempted treatments: advil at bedtime.   No prior surgery or injection. Right-hand-dominant.  No hx of PUD, no hx of CKD.    History Patient Active Problem List   Diagnosis Date Noted   RUQ abdominal pain 06/10/2021   Bloating 06/10/2021   Gastroesophageal reflux disease 06/10/2021   No past medical history on file. Past Surgical History:  Procedure Laterality Date   ANTERIOR CRUCIATE LIGAMENT REPAIR Right    2012   ANTERIOR CRUCIATE LIGAMENT REPAIR     APPENDECTOMY     1984   No Known Allergies Prior to Admission medications   Medication Sig Start Date End Date Taking? Authorizing Provider  erythromycin ophthalmic ointment Place into the right eye 3 (three) times daily. Apply to lower lid, 1/2 inch ribbon. Up to 1 week. 04/20/22   Shade Flood, MD   Social History   Socioeconomic History   Marital status: Unknown    Spouse name: Not on file   Number of children: Not on file   Years of education: Not on file   Highest education level: Not on file  Occupational History   Not on file  Tobacco Use   Smoking status: Never    Smokeless tobacco: Never  Substance and Sexual Activity   Alcohol use: Never   Drug use: Never   Sexual activity: Yes  Other Topics Concern   Not on file  Social History Narrative   Not on file   Social Drivers of Health   Financial Resource Strain: Not on file  Food Insecurity: Not on file  Transportation Needs: Not on file  Physical Activity: Sufficiently Active (09/06/2023)   Exercise Vital Sign    Days of Exercise per Week: 4 days    Minutes of Exercise per Session: 150+ min  Stress: No Stress Concern Present (09/06/2023)   [employer] of Occupational Health - Occupational Stress Questionnaire    Feeling of Stress : Not at all  Social Connections: Not on file  Intimate Partner Violence: Not on file    Review of Systems  Per HPI Objective:   Vitals:   09/06/23 1132  BP: 126/84  Pulse: 61  Temp: 98.2 F (36.8 C)  TempSrc: Temporal  SpO2: 100%  Weight: 183 lb 9.6 oz (83.3 kg)  Height: 5\' 9"  (1.753 m)     Physical Exam Constitutional:      General: He is not in acute distress.    Appearance: Normal appearance. He is well-developed.  HENT:     Head: Normocephalic and atraumatic.  Cardiovascular:     Rate and Rhythm:  Normal rate.  Pulmonary:     Effort: Pulmonary effort is normal.  Musculoskeletal:     Comments: C-spine, no midline bony tenderness, pain-free range of motion and does not reproduce shoulder symptoms Right shoulder, intact active range of motion with slight discomfort on flexion, abduction.  AC, [place], clavicle nontender no focal bony tenderness of humerus, scapula. Slight posterior shoulder tenderness inferior to acromion, but nontender over proximal biceps tendon, bicipital groove. Strength intact but painful empty can.  Negative drop arm. Pain, weakness with pain on external rotation testing, normal, pain-free internal rotation and liftoff testing.   Negative Neer, Hawkins. Positive speeds test.  Neurovascular intact  distally. Remainder of upper arm, elbow, forearm nontender  Neurological:     Mental Status: He is alert and oriented to person, place, and time.  Psychiatric:        Mood and Affect: Mood normal.        Assessment & Plan:  Douglas Mcfarland is a 53 y.o. male . Acute pain of right shoulder - Plan: meloxicam (MOBIC) 7.5 MG tablet, Ambulatory referral to Orthopedic Surgery  Rotator cuff tendinitis, right - Plan: meloxicam (MOBIC) 7.5 MG tablet, Ambulatory referral to Orthopedic Surgery  Bicipital tendinitis of right shoulder - Plan: meloxicam (MOBIC) 7.5 MG tablet, Ambulatory referral to Orthopedic Surgery  Longstanding discomfort of right shoulder, suspect component of rotator cuff tendinosis now with worsening pain past few months, impacting pain and sleep as well.  Could have chronic tear with secondary tendinosis, possible early adhesive capsulitis but no loss of motion at this time.  Also appears to have a component of bicipital tenosynovitis.  Without recent trauma/injury known, will hold on imaging at this time but I think it would be best for him to see a shoulder specialist to decide on possible injection, glenohumeral or bicipital, consideration of imagine/advanced imaging or PT.  Referral placed.  Temporary prescription for Mobic, in place of Advil as needed.  RTC precautions.   Meds ordered this encounter  Medications   meloxicam (MOBIC) 7.5 MG tablet    Sig: Take 1-2 tablets (7.5-15 mg total) by mouth daily as needed.    Dispense:  30 tablet    Refill:  0   Patient Instructions  See info on shoulder pain below. I suspect you have some rotator cuff syndrome and possible biceps tendon inflammation.  Start meloxicam once per day. Do not combine with advil. I will refer you to orthopaedic specialist.  Cut back on overhead lifting and decrease weight for shoulder exercises for now.  Let me know if there are questions.    Shoulder Pain Many things can cause shoulder pain,  including: An injury to the shoulder. Overuse of the shoulder. Arthritis. The source of the pain can be: Inflammation. An injury to the shoulder joint. An injury to a tendon, ligament, or bone. Follow these instructions at home: Pay attention to changes in your symptoms. Let your health care provider know about them. Follow these instructions to relieve your pain. If you have a removable sling: Wear the sling as told by your provider. Remove it only as told by your provider. Check the skin around the sling every day. Tell your provider about any concerns. Loosen the sling if your fingers tingle, become numb, or become cold. Keep the sling clean. If the sling is not waterproof: Do not let it get wet. Remove it to shower or bathe. Move your arm as little as possible, but keep your hand moving to prevent swelling. Managing  pain, stiffness, and swelling  If told, put ice on the painful area. If you have a removable sling or immobilizer, remove it as told by your provider. Put ice in a plastic bag. Place a towel between your skin and the bag. Leave the ice on for 20 minutes, 2-3 times a day. If your skin turns bright red, remove the ice right away to prevent skin damage. The risk of damage is higher if you cannot feel pain, heat, or cold. Move your fingers often to reduce stiffness and swelling. Squeeze a soft ball or a foam pad as much as possible. This helps to keep the shoulder from swelling. It also helps to strengthen the arm. General instructions Take over-the-counter and prescription medicines only as told by your provider. Exercise may help with pain management. Perform exercises if told by your provider. You may be referred to a physical therapist to help in your recovery process. Keep all follow-up visits in order to avoid any type of permanent shoulder disability or chronic pain problems. Contact a health care provider if: Your pain is not relieved with medicines. New pain  develops in your arm, hand, or fingers. You loosen your sling and your arm, hand, or fingers remain tingly, numb, swollen, or painful. Get help right away if: Your arm, hand, or fingers turn white or blue. This information is not intended to replace advice given to you by your health care provider. Make sure you discuss any questions you have with your health care provider. Document Revised: 01/30/2022 Document Reviewed: 01/30/2022 Elsevier Patient Education  2024 Elsevier [employer].     Signed,   Meredith Staggers, MD [facility], [facility] 09/06/23 12:43 PM

## 2023-10-18 DIAGNOSIS — M7541 Impingement syndrome of right shoulder: Secondary | ICD-10-CM | POA: Diagnosis not present

## 2023-11-11 ENCOUNTER — Telehealth: Payer: Self-pay | Admitting: Family Medicine

## 2023-11-11 NOTE — Telephone Encounter (Signed)
 Copied from CRM [id number]. Topic: General - Other >> Nov 11, 2023 12:30 PM Douglas Mcfarland wrote: Reason for CRM: patient is requesting a shingles vaccine  Can this pt get this vaccine completed?

## 2023-11-11 NOTE — Telephone Encounter (Signed)
 Patient is requesting to have a Shingles vaccine      Needs provider approval/order prior to scheduling? Yes  Message sent to clinic pool for authorization/order? Yes  Please call patient once ordered to schedule office visit or nurse visit at:  [phone]    Patient was last seen in February 2025, Shingles is due in [facility]

## 2023-11-11 NOTE — Telephone Encounter (Signed)
 Immunization History  Administered Date(s) Administered   Hepatitis A, Adult 09/22/2007   Influenza,inj,Quad PF,6+ Mos 05/23/2012, 05/18/2016, 05/03/2017, 04/18/2018, 05/17/2020, 03/24/2022   Influenza-Unspecified 05/19/2021, 05/06/2023   PFIZER Comirnaty(Gray Top)Covid-19 Tri-Sucrose Vaccine 12/13/2020   PFIZER(Purple Top)SARS-COV-2 Vaccination 10/16/2019, 11/13/2019, 06/12/2020   Td (Adult),5 Lf Tetanus Toxid, Preservative Free 06/30/2016   Tdap 02/26/2006   Okay to schedule nurse visit for Shingrix with repeat in 2 to 6 months.  Diagnosis of need for shingles vaccine.  Thanks

## 2023-11-12 NOTE — Telephone Encounter (Signed)
 Pt is going to [facility] for this
